# Patient Record
Sex: Female | Born: 2000 | Race: Black or African American | Hispanic: No | Marital: Single | Smoking: Never smoker
Health system: Southern US, Community
[De-identification: ages and names within clinical notes are randomized; demographics above are authoritative.]

---
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
## 2020-07-01 ENCOUNTER — Encounter (HOSPITAL_COMMUNITY): Payer: Self-pay

## 2020-07-01 ENCOUNTER — Ambulatory Visit (HOSPITAL_COMMUNITY)
Admission: EM | Admit: 2020-07-01 | Discharge: 2020-07-01 | Disposition: A | Discharge status: Home / Self Care | Payer: Medicaid Other | Attending: Emergency Medicine | Admitting: Emergency Medicine

## 2020-07-01 ENCOUNTER — Other Ambulatory Visit: Payer: Self-pay

## 2020-07-01 DIAGNOSIS — R051 Acute cough: Secondary | ICD-10-CM | POA: Diagnosis present

## 2020-07-01 DIAGNOSIS — J039 Acute tonsillitis, unspecified: Secondary | ICD-10-CM | POA: Diagnosis not present

## 2020-07-01 DIAGNOSIS — Z79899 Other long term (current) drug therapy: Secondary | ICD-10-CM | POA: Diagnosis not present

## 2020-07-01 DIAGNOSIS — Z20822 Contact with and (suspected) exposure to covid-19: Secondary | ICD-10-CM | POA: Insufficient documentation

## 2020-07-01 DIAGNOSIS — Z7722 Contact with and (suspected) exposure to environmental tobacco smoke (acute) (chronic): Secondary | ICD-10-CM | POA: Insufficient documentation

## 2020-07-01 LAB — SARS CORONAVIRUS 2 (TAT 6-24 HRS): SARS Coronavirus 2: NEGATIVE

## 2020-07-01 LAB — TSH: TSH: 1.083 u[IU]/mL (ref 0.350–4.500)

## 2020-07-01 LAB — POCT RAPID STREP A, ED / UC: Streptococcus, Group A Screen (Direct): NEGATIVE

## 2020-07-01 MED ORDER — AMOXICILLIN 500 MG PO CAPS: 500.0000 mg | ORAL_CAPSULE | Freq: Two times a day (BID) | ORAL | 0 refills | Status: AC

## 2020-07-01 NOTE — ED Provider Notes (Signed)
[facility]    CSN: [id number] Arrival date & time: 07/01/20  1234      History   Chief Complaint Chief Complaint  Patient presents with  . Cough  . Sore Throat    HPI Linda Galvan is a 20 y.o. female.   Linda Galvan presents with complaints of sore throat which started two days ago, pain with swallowing. She has a mild cough now, which causes pain. No known fevers. No rash. No gi symptoms. No ear pain. She states she feels like a "golf ball" to her esophagus. She has felt like it is swollen to her anterior neck. She has visualized "white spots" to her tonsils. No shortness of breath . Hasn't taken any medications for her symptoms. No thyroid issues in the past.    ROS per HPI, negative if not otherwise mentioned.      History reviewed. No pertinent past medical history.  There are no problems to display for this patient.   History reviewed. No pertinent surgical history.  OB History   No obstetric history on file.      Home Medications    Prior to Admission medications   Medication Sig Start Date End Date Taking? Authorizing Provider  amoxicillin (AMOXIL) 500 MG capsule Take 1 capsule (500 mg total) by mouth 2 (two) times daily for 10 days. 07/01/20 07/11/20 Yes Blima Jaimes, Barron Alvine, NP  cetirizine (ZYRTEC) 10 MG tablet Take 10 mg by mouth daily.    [provider]  fluticasone (FLONASE) 50 MCG/ACT nasal spray Place 2 sprays into the nose daily.    [provider]  ibuprofen (ADVIL,MOTRIN) 600 MG tablet Take 1 tablet (600 mg total) by mouth every 6 (six) hours as needed. 01/22/14   Toy Cookey, MD  naproxen sodium (ANAPROX) 220 MG tablet Take 110 mg by mouth daily as needed. For pain    [provider]  ranitidine (ZANTAC) 300 MG tablet Take 1 tablet (300 mg total) by mouth at bedtime. 01/22/14   Toy Cookey, MD    Family History History reviewed. No pertinent family history.  Social History Social History    Tobacco Use  . Smoking status: Passive Smoke Exposure - Never Smoker  . Smokeless tobacco: Never Used     Allergies   Patient has no known allergies.   Review of Systems Review of Systems   Physical Exam Triage Vital Signs ED Triage Vitals  Enc Vitals Group     BP 07/01/20 1256 135/72     Pulse Rate 07/01/20 1256 77     Resp 07/01/20 1256 20     Temp 07/01/20 1256 98.7 F (37.1 C)     Temp Source 07/01/20 1256 Oral     SpO2 07/01/20 1256 100 %     Weight --      Height --      Head Circumference --      Peak Flow --      Pain Score 07/01/20 1254 5     Pain Loc --      Pain Edu? --      Excl. in GC? --    No data found.  Updated Vital Signs BP 135/72 (BP Location: Right Arm)   Pulse 77   Temp 98.7 F (37.1 C) (Oral)   Resp 20   LMP  (LMP Unknown)   SpO2 100%   Visual Acuity Right Eye Distance:   Left Eye Distance:   Bilateral Distance:    Right Eye Near:  Left Eye Near:    Bilateral Near:     Physical Exam Constitutional:      General: She is not in acute distress.    Appearance: She is well-developed.  HENT:     Right Ear: Tympanic membrane normal.     Left Ear: Tympanic membrane normal.     Mouth/Throat:     Tonsils: Tonsillar exudate present. 2+ on the right. 2+ on the left.  Cardiovascular:     Rate and Rhythm: Normal rate.  Pulmonary:     Effort: Pulmonary effort is normal.  Lymphadenopathy:     Cervical: Cervical adenopathy present.     Right cervical: Superficial cervical adenopathy present.     Left cervical: Superficial cervical adenopathy present.  Skin:    General: Skin is warm and dry.  Neurological:     Mental Status: She is alert and oriented to person, place, and time.      UC Treatments / Results  Labs (all labs ordered are listed, but only abnormal results are displayed) Labs Reviewed  SARS CORONAVIRUS 2 (TAT 6-24 HRS)  TSH  POCT RAPID STREP A, ED / UC    EKG   Radiology No results  found.  Procedures Procedures (including critical care time)  Medications Ordered in UC Medications - No data to display  Initial Impression / Assessment and Plan / UC Course  I have reviewed the triage vital signs and the nursing notes.  Pertinent labs & imaging results that were available during my care of the patient were reviewed by me and considered in my medical decision making (see chart for details).     Exudative tonsillitis on exam, negative strep with culture pending. Covid testing pending and isolation instructions provided.  Likely cervical adenopathy, but also given location tsh collected as well, which is normal. Return precautions provided. Patient verbalized understanding and agreeable to plan.   Final Clinical Impressions(s) / UC Diagnoses   Final diagnoses:  Acute tonsillitis, unspecified etiology     Discharge Instructions     I am starting antibiotics for you given what your tonsils looks like today on exam.  We will notify you if your strep or covid test is positive. If negative you will not get a call- view your results on your MyCHart. If your covid-19 test is positive you can stop taking the antibiotics, as it is a virus which does not respond to antibiotics.  I suspect lymph node swelling is contributing to your neck swelling and sensation and will improve once your throat improves or shortly after.  We are screening to ensure your thyroid level is normal, however.  If any worsening of symptoms please return or go to the ER- particularly difficulty swallowing or if food starts coming back up.    ED Prescriptions    Medication Sig Dispense Auth. Provider   amoxicillin (AMOXIL) 500 MG capsule Take 1 capsule (500 mg total) by mouth 2 (two) times daily for 10 days. 20 capsule Georgetta Haber, NP     PDMP not reviewed this encounter.   Georgetta Haber, NP 07/01/20 2039

## 2020-07-01 NOTE — ED Triage Notes (Signed)
Pt c/o sore throat x 2 days. Pt states when she coughs her chest hurts.

## 2020-07-01 NOTE — Discharge Instructions (Signed)
I am starting antibiotics for you given what your tonsils looks like today on exam.  We will notify you if your strep or covid test is positive. If negative you will not get a call- view your results on your MyCHart. If your covid-19 test is positive you can stop taking the antibiotics, as it is a virus which does not respond to antibiotics.  I suspect lymph node swelling is contributing to your neck swelling and sensation and will improve once your throat improves or shortly after.  We are screening to ensure your thyroid level is normal, however.  If any worsening of symptoms please return or go to the ER- particularly difficulty swallowing or if food starts coming back up.

## 2020-07-02 LAB — CULTURE, GROUP A STREP (THRC)

## 2020-07-03 LAB — CULTURE, GROUP A STREP (THRC)

## 2020-09-06 ENCOUNTER — Encounter (HOSPITAL_COMMUNITY): Payer: Self-pay | Admitting: Emergency Medicine

## 2020-09-06 ENCOUNTER — Ambulatory Visit (HOSPITAL_COMMUNITY)
Admission: EM | Admit: 2020-09-06 | Discharge: 2020-09-06 | Disposition: A | Discharge status: Home / Self Care | Payer: Medicaid Other | Attending: Emergency Medicine | Admitting: Emergency Medicine

## 2020-09-06 DIAGNOSIS — R220 Localized swelling, mass and lump, head: Secondary | ICD-10-CM | POA: Diagnosis not present

## 2020-09-06 DIAGNOSIS — R6884 Jaw pain: Secondary | ICD-10-CM

## 2020-09-06 DIAGNOSIS — H6502 Acute serous otitis media, left ear: Secondary | ICD-10-CM

## 2020-09-06 MED ORDER — AMOXICILLIN-POT CLAVULANATE 875-125 MG PO TABS: 1.0000 | ORAL_TABLET | Freq: Two times a day (BID) | ORAL | 0 refills | Status: DC

## 2020-09-06 MED ORDER — TRAMADOL HCL 50 MG PO TABS: 50.0000 mg | ORAL_TABLET | Freq: Four times a day (QID) | ORAL | 0 refills | Status: DC | PRN

## 2020-09-06 MED ORDER — KETOROLAC TROMETHAMINE 60 MG/2ML IM SOLN
60.0000 mg | Freq: Once | INTRAMUSCULAR | Status: AC
  Administered 2020-09-06: 60 mg via INTRAMUSCULAR

## 2020-09-06 MED ORDER — KETOROLAC TROMETHAMINE 60 MG/2ML IM SOLN
INTRAMUSCULAR | Status: AC
  Filled 2020-09-06: qty 2

## 2020-09-06 NOTE — ED Provider Notes (Signed)
[facility]    CSN: [id number] Arrival date & time: 09/06/20  1130      History   Chief Complaint Chief Complaint  Patient presents with   Otalgia    left    HPI Linda Galvan is a 20 y.o. female.   Lt ear pain for a few days now,. Pt is tearful when in room in pain . States that it has caused swelling to her jaw area and neck. Tried peroxide and motrin to ear last night with no relief.    History reviewed. No pertinent past medical history.  There are no problems to display for this patient.   History reviewed. No pertinent surgical history.  OB History   No obstetric history on file.      Home Medications    Prior to Admission medications   Medication Sig Start Date End Date Taking? Authorizing Provider  amoxicillin-clavulanate (AUGMENTIN) 875-125 MG tablet Take 1 tablet by mouth every 12 (twelve) hours. 09/06/20  Yes Coralyn Mark, NP  traMADol (ULTRAM) 50 MG tablet Take 1 tablet (50 mg total) by mouth every 6 (six) hours as needed. 09/06/20  Yes Coralyn Mark, NP  cetirizine (ZYRTEC) 10 MG tablet Take 10 mg by mouth daily.    [provider]  fluticasone (FLONASE) 50 MCG/ACT nasal spray Place 2 sprays into the nose daily.    [provider]  ibuprofen (ADVIL,MOTRIN) 600 MG tablet Take 1 tablet (600 mg total) by mouth every 6 (six) hours as needed. 01/22/14   Toy Cookey, MD  naproxen sodium (ANAPROX) 220 MG tablet Take 110 mg by mouth daily as needed. For pain    [provider]  ranitidine (ZANTAC) 300 MG tablet Take 1 tablet (300 mg total) by mouth at bedtime. 01/22/14   Toy Cookey, MD    Family History History reviewed. No pertinent family history.  Social History Social History   Tobacco Use   Smoking status: Passive Smoke Exposure - Never Smoker   Smokeless tobacco: Never     Allergies   Patient has no known allergies.   Review of Systems Review of Systems  Constitutional:  Positive for  fever. Negative for appetite change.  HENT:  Positive for ear discharge and ear pain.        Lt side swelling down jaw area   Respiratory: Negative.    Cardiovascular: Negative.   Gastrointestinal: Negative.   Neurological: Negative.     Physical Exam Triage Vital Signs ED Triage Vitals  Enc Vitals Group     BP 09/06/20 1220 (!) 133/92     Pulse Rate 09/06/20 1220 88     Resp 09/06/20 1220 18     Temp 09/06/20 1220 98.8 F (37.1 C)     Temp src --      SpO2 09/06/20 1220 99 %     Weight --      Height --      Head Circumference --      Peak Flow --      Pain Score 09/06/20 1219 10     Pain Loc --      Pain Edu? --      Excl. in GC? --    No data found.  Updated Vital Signs BP (!) 133/92   Pulse 88   Temp 98.8 F (37.1 C)   Resp 18   SpO2 99%   Visual Acuity Right Eye Distance:   Left Eye Distance:   Bilateral Distance:  Right Eye Near:   Left Eye Near:    Bilateral Near:     Physical Exam Constitutional:      General: She is in acute distress.     Appearance: She is ill-appearing.  HENT:     Right Ear: Tympanic membrane, ear canal and external ear normal.     Left Ear: Drainage, swelling and tenderness present. Tympanic membrane is erythematous and bulging.     Mouth/Throat:     Mouth: Mucous membranes are moist.  Eyes:     Pupils: Pupils are equal, round, and reactive to light.  Cardiovascular:     Rate and Rhythm: Normal rate.     Pulses: Normal pulses.  Pulmonary:     Effort: Pulmonary effort is normal.  Abdominal:     General: Abdomen is flat.  Musculoskeletal:        General: Normal range of motion.     Cervical back: Tenderness present.  Skin:    General: Skin is warm.  Neurological:     Mental Status: She is alert.     UC Treatments / Results  Labs (all labs ordered are listed, but only abnormal results are displayed) Labs Reviewed - No data to display  EKG   Radiology No results found.  Procedures Procedures (including  critical care time)  Medications Ordered in UC Medications  ketorolac (TORADOL) injection 60 mg (60 mg Intramuscular Given 09/06/20 1308)    Initial Impression / Assessment and Plan / UC Course  I have reviewed the triage vital signs and the nursing notes.  Pertinent labs & imaging results that were available during my care of the patient were reviewed by me and considered in my medical decision making (see chart for details).     Discussed the risk of infection  Take full dose of abx  Take pain meds as needed with food  Avoid putting water or anything in the ear  If not better in 48 hours you will need to return   Final Clinical Impressions(s) / UC Diagnoses   Final diagnoses:  Non-recurrent acute serous otitis media of left ear  Jaw pain  Facial swelling   Discharge Instructions   None    ED Prescriptions     Medication Sig Dispense Auth. Provider   traMADol (ULTRAM) 50 MG tablet Take 1 tablet (50 mg total) by mouth every 6 (six) hours as needed. 15 tablet Maple Mirza L, NP   amoxicillin-clavulanate (AUGMENTIN) 875-125 MG tablet Take 1 tablet by mouth every 12 (twelve) hours. 14 tablet Coralyn Mark, NP      I have reviewed the PDMP during this encounter.   Coralyn Mark, NP 09/06/20 1312

## 2020-09-06 NOTE — ED Triage Notes (Signed)
Pt is present today with left ear pain, HA, and left jaw pain. Pt states that the pain started a couple months ago on and off.

## 2022-03-24 ENCOUNTER — Other Ambulatory Visit: Payer: Self-pay

## 2022-03-24 ENCOUNTER — Encounter (HOSPITAL_COMMUNITY): Payer: Self-pay | Admitting: *Deleted

## 2022-03-24 ENCOUNTER — Ambulatory Visit (HOSPITAL_COMMUNITY)
Admission: EM | Admit: 2022-03-24 | Discharge: 2022-03-24 | Disposition: A | Discharge status: Home / Self Care | Payer: Medicaid Other | Attending: Internal Medicine | Admitting: Internal Medicine

## 2022-03-24 DIAGNOSIS — J039 Acute tonsillitis, unspecified: Secondary | ICD-10-CM | POA: Insufficient documentation

## 2022-03-24 DIAGNOSIS — E049 Nontoxic goiter, unspecified: Secondary | ICD-10-CM | POA: Insufficient documentation

## 2022-03-24 LAB — T4, FREE: Free T4: 0.83 ng/dL (ref 0.61–1.12)

## 2022-03-24 LAB — TSH: TSH: 0.625 u[IU]/mL (ref 0.350–4.500)

## 2022-03-24 LAB — POCT RAPID STREP A, ED / UC: Streptococcus, Group A Screen (Direct): NEGATIVE

## 2022-03-24 LAB — POCT INFECTIOUS MONO SCREEN, ED / UC: Mono Screen: NEGATIVE

## 2022-03-24 MED ORDER — AMOXICILLIN 500 MG PO TABS: 500.0000 mg | ORAL_TABLET | Freq: Two times a day (BID) | ORAL | 0 refills | Status: AC

## 2022-03-24 NOTE — Discharge Instructions (Signed)
Go ahead and take antibiotic as prescribed.   I am prescribing this due to the appearance of your tonsils.  As we discussed, your thyroid appears quite enlarged on exam which does not seem like a new issue for you.  However, I would like you to follow-up with a primary care provider to have this worked up further.  I have obtained some basic labs today for them to follow-up on.  Go ahead and call [facility] on Monday to see if you can reestablish care.  Someone from our clinic will reach out to you should you need any help finding a provider.  Please return for any worsening or persistent symptoms.

## 2022-03-24 NOTE — ED Provider Notes (Signed)
[place]    CSN: [id number] Arrival date & time: 03/24/22  1058      History   Chief Complaint Chief Complaint  Patient presents with   Lymphadenopathy    HPI Linda Galvan is a 22 y.o. female presents urgent care today with sensation in her neck when swallowing.  Patient states symptoms began this morning and she noticed white spots on tonsils.  Patient denies any difficulty breathing, recent fever or chills.  She reports neck fullness has been present for years and has been mention to her several times in the past but has not been worked up.  Patient denies any recent fatigue, weight loss or gain, dry skin, brittle nails, heat or cold intolerance.   History reviewed. No pertinent past medical history.  There are no problems to display for this patient.   History reviewed. No pertinent surgical history.  OB History   No obstetric history on file.      Home Medications    Prior to Admission medications   Medication Sig Start Date End Date Taking? Authorizing Provider  amoxicillin (AMOXIL) 500 MG tablet Take 1 tablet (500 mg total) by mouth 2 (two) times daily for 10 days. 03/24/22 04/03/22 Yes Rudolpho Sevin, NP  cetirizine (ZYRTEC) 10 MG tablet Take 10 mg by mouth daily.    [provider]  fluticasone (FLONASE) 50 MCG/ACT nasal spray Place 2 sprays into the nose daily.    [provider]  ibuprofen (ADVIL,MOTRIN) 600 MG tablet Take 1 tablet (600 mg total) by mouth every 6 (six) hours as needed. 01/22/14   Ernestina Patches, MD  naproxen sodium (ANAPROX) 220 MG tablet Take 110 mg by mouth daily as needed. For pain    [provider]  ranitidine (ZANTAC) 300 MG tablet Take 1 tablet (300 mg total) by mouth at bedtime. 01/22/14   Ernestina Patches, MD  traMADol (ULTRAM) 50 MG tablet Take 1 tablet (50 mg total) by mouth every 6 (six) hours as needed. 09/06/20   Marney Setting, NP    Family History History reviewed. No pertinent family  history.  Social History Social History   Tobacco Use   Smoking status: Never    Passive exposure: Yes   Smokeless tobacco: Never     Allergies   Patient has no known allergies.   Review of Systems As stated in HPI otherwise negative   Physical Exam Triage Vital Signs ED Triage Vitals  Enc Vitals Group     BP 03/24/22 1225 120/75     Pulse Rate 03/24/22 1225 74     Resp 03/24/22 1225 16     Temp 03/24/22 1225 98.7 F (37.1 C)     Temp src --      SpO2 03/24/22 1225 97 %     Weight --      Height --      Head Circumference --      Peak Flow --      Pain Score 03/24/22 1223 1     Pain Loc --      Pain Edu? --      Excl. in [place]? --    No data found.  Updated Vital Signs BP 120/75   Pulse 74   Temp 98.7 F (37.1 C)   Resp 16   SpO2 97%   Visual Acuity Right Eye Distance:   Left Eye Distance:   Bilateral Distance:    Right Eye Near:   Left Eye Near:  Bilateral Near:     Physical Exam Constitutional:      General: She is not in acute distress.    Appearance: Normal appearance. She is not ill-appearing or toxic-appearing.  HENT:     Nose: No congestion or rhinorrhea.     Mouth/Throat:     Mouth: Mucous membranes are moist.     Pharynx: No posterior oropharyngeal erythema.     Comments: 2-3+ tonsillar swelling bilaterally with scattered tonsillar stones.  Airway intact Eyes:     Extraocular Movements: Extraocular movements intact.     Conjunctiva/sclera: Conjunctivae normal.  Neck:     Comments: Thyromegaly Cardiovascular:     Rate and Rhythm: Normal rate and regular rhythm.     Heart sounds: No murmur heard.    No friction rub. No gallop.  Pulmonary:     Effort: Pulmonary effort is normal.     Breath sounds: Normal breath sounds. No stridor. No wheezing, rhonchi or rales.  Abdominal:     General: Bowel sounds are normal.     Palpations: Abdomen is soft.  Musculoskeletal:     Cervical back: Normal range of motion and neck supple. No  rigidity or tenderness.  Lymphadenopathy:     Cervical: No cervical adenopathy.  Skin:    General: Skin is warm and dry.  Neurological:     General: No focal deficit present.     Mental Status: She is alert and oriented to person, place, and time.  Psychiatric:        Mood and Affect: Mood normal.        Behavior: Behavior normal.      UC Treatments / Results  Labs (all labs ordered are listed, but only abnormal results are displayed) Labs Reviewed  CULTURE, GROUP A STREP ([place])  T3  T4, FREE  TSH  POCT RAPID STREP A, ED / UC  POCT INFECTIOUS MONO SCREEN, ED / UC    EKG   Radiology No results found.  Procedures Procedures (including critical care time)  Medications Ordered in UC Medications - No data to display  Initial Impression / Assessment and Plan / UC Course  I have reviewed the triage vital signs and the nursing notes.  Pertinent labs & imaging results that were available during my care of the patient were reviewed by me and considered in my medical decision making (see chart for details)  Tonsillitis Thyromegaly Patient appears to have 2 different issues at time of visit.  Tonsillitis appears acute.  Given appearance of tonsils we will go ahead and treat empirically with amoxicillin twice daily x 10 days despite rapid negative strep.  Rapid Monospot negative.  Will send swab for culture.  Upon discussion of patient's fullness and neck, she reports that has been present for several years though never worked up.  She denies any symptoms of hypo or hyperthyroidism.  Will check basic thyroid function panel in clinic today and have patient establish with primary provider for further evaluation.  She will likely need ultrasound and possible FNA.  She was previously a patient at [facility].  I have asked her to reach out to them Monday to see if she can reestablish care.  Reviewed expections re: course of current medical issues. Questions answered. Outlined  signs and symptoms indicating need for more acute intervention. Pt verbalized understanding. AVS given  Final Clinical Impressions(s) / UC Diagnoses   Final diagnoses:  Acute tonsillitis, unspecified etiology  Thyroid enlargement     Discharge Instructions  Go ahead and take antibiotic as prescribed.   I am prescribing this due to the appearance of your tonsils.  As we discussed, your thyroid appears quite enlarged on exam which does not seem like a new issue for you.  However, I would like you to follow-up with a primary care provider to have this worked up further.  I have obtained some basic labs today for them to follow-up on.  Go ahead and call [facility] on Monday to see if you can reestablish care.  Someone from our clinic will reach out to you should you need any help finding a provider.  Please return for any worsening or persistent symptoms.     ED Prescriptions     Medication Sig Dispense Auth. Provider   amoxicillin (AMOXIL) 500 MG tablet Take 1 tablet (500 mg total) by mouth 2 (two) times daily for 10 days. 20 tablet Rudolpho Sevin, NP      PDMP not reviewed this encounter.   Rudolpho Sevin, NP 03/24/22 1420

## 2022-03-24 NOTE — ED Triage Notes (Signed)
Pt reports feeling a swollen sensation in her neck when swallowing. This started this AM.

## 2022-03-25 LAB — CULTURE, GROUP A STREP (THRC)

## 2022-03-26 LAB — CULTURE, GROUP A STREP (THRC)

## 2022-03-27 LAB — T3: T3, Total: 101 ng/dL (ref 71–180)

## 2022-04-01 ENCOUNTER — Encounter (HOSPITAL_COMMUNITY): Payer: Self-pay

## 2022-04-01 ENCOUNTER — Ambulatory Visit (HOSPITAL_COMMUNITY)
Admission: RE | Admit: 2022-04-01 | Discharge: 2022-04-01 | Disposition: A | Discharge status: Home / Self Care | Payer: Medicaid Other | Source: Ambulatory Visit | Attending: Emergency Medicine | Admitting: Emergency Medicine

## 2022-04-01 ENCOUNTER — Other Ambulatory Visit: Payer: Self-pay

## 2022-04-01 VITALS — BP 124/68 | HR 94 | Temp 98.7°F | Resp 18

## 2022-04-01 DIAGNOSIS — B379 Candidiasis, unspecified: Secondary | ICD-10-CM | POA: Insufficient documentation

## 2022-04-01 DIAGNOSIS — T3695XA Adverse effect of unspecified systemic antibiotic, initial encounter: Secondary | ICD-10-CM | POA: Insufficient documentation

## 2022-04-01 MED ORDER — FLUCONAZOLE 150 MG PO TABS: 150.0000 mg | ORAL_TABLET | Freq: Once | ORAL | 0 refills | Status: DC | PRN

## 2022-04-01 NOTE — Discharge Instructions (Addendum)
Please finish the full course of amoxicillin  Take the fluconazole as prescribed to treat possible yeast infection. We will call you if swab results require further treatment   If needed you can return to urgent care, or call the ob/gyn to make an appointment

## 2022-04-01 NOTE — ED Provider Notes (Signed)
[place]    CSN: [id number] Arrival date & time: 04/01/22  1359     History   Chief Complaint Chief Complaint  Patient presents with   Vaginal Discharge    I couldn't find my option but i have been taking antibiotics for my tonsils, but its been making me have vaginal pain , - Entered by patient   Vaginal Pain   Vaginal Itching    HPI Linda Galvan is a 22 y.o. female.  Presents with vaginal pain and itching White, clumpy vaginal discharge  Dx with strep one week ago, has been taking amoxicillin. Symptoms started 2 days after starting antibiotic No bleeding, spotting, abd pain  History reviewed. No pertinent past medical history.  There are no problems to display for this patient.   History reviewed. No pertinent surgical history.  OB History   No obstetric history on file.      Home Medications    Prior to Admission medications   Medication Sig Start Date End Date Taking? Authorizing Provider  fluconazole (DIFLUCAN) 150 MG tablet Take 1 tablet (150 mg total) by mouth once as needed for up to 2 doses (take one pill on day 1, and the second pill 3 days later). 04/01/22  Yes Bryley Kovacevic, Wells Guiles, PA-C  amoxicillin (AMOXIL) 500 MG tablet Take 1 tablet (500 mg total) by mouth 2 (two) times daily for 10 days. 03/24/22 04/03/22  Rudolpho Sevin, NP  cetirizine (ZYRTEC) 10 MG tablet Take 10 mg by mouth daily.    [provider]  fluticasone (FLONASE) 50 MCG/ACT nasal spray Place 2 sprays into the nose daily.    [provider]  naproxen sodium (ANAPROX) 220 MG tablet Take 110 mg by mouth daily as needed. For pain    [provider]    Family History History reviewed. No pertinent family history.  Social History Social History   Tobacco Use   Smoking status: Never    Passive exposure: Yes   Smokeless tobacco: Never     Allergies   Patient has no known allergies.   Review of Systems Review of Systems As per HPI  Physical  Exam Triage Vital Signs ED Triage Vitals  Enc Vitals Group     BP      Pulse      Resp      Temp      Temp src      SpO2      Weight      Height      Head Circumference      Peak Flow      Pain Score      Pain Loc      Pain Edu?      Excl. in [place]?    No data found.  Updated Vital Signs BP 124/68   Pulse 94   Temp 98.7 F (37.1 C)   Resp 18   SpO2 97%   Physical Exam Vitals and nursing note reviewed.  Constitutional:      Appearance: Normal appearance.  HENT:     Mouth/Throat:     Pharynx: Oropharynx is clear.  Cardiovascular:     Rate and Rhythm: Normal rate and regular rhythm.     Pulses: Normal pulses.  Pulmonary:     Effort: Pulmonary effort is normal.  Abdominal:     Palpations: Abdomen is soft.     Tenderness: There is no abdominal tenderness.  Genitourinary:    Comments: deferred Neurological:  Mental Status: She is alert and oriented to person, place, and time.     UC Treatments / Results  Labs (all labs ordered are listed, but only abnormal results are displayed) Labs Reviewed  CERVICOVAGINAL ANCILLARY ONLY    EKG  Radiology No results found.  Procedures Procedures (including critical care time)  Medications Ordered in UC Medications - No data to display  Initial Impression / Assessment and Plan / UC Course  I have reviewed the triage vital signs and the nursing notes.  Pertinent labs & imaging results that were available during my care of the patient were reviewed by me and considered in my medical decision making (see chart for details).  Cytology swab pending for BV and yeast Given abx use, will cover for yeast with 2 dose fluconazole  Discussed continue full course of amox until completion  Provided ob/gyn clinic for follow up prn  Final Clinical Impressions(s) / UC Diagnoses   Final diagnoses:  Antibiotic-induced yeast infection     Discharge Instructions      Please finish the full course of amoxicillin  Take  the fluconazole as prescribed to treat possible yeast infection. We will call you if swab results require further treatment   If needed you can return to urgent care, or call the ob/gyn to make an appointment      ED Prescriptions     Medication Sig Dispense Auth. Provider   fluconazole (DIFLUCAN) 150 MG tablet Take 1 tablet (150 mg total) by mouth once as needed for up to 2 doses (take one pill on day 1, and the second pill 3 days later). 2 tablet Shavona Gunderman, Wells Guiles, PA-C      PDMP not reviewed this encounter.   Les Pou, [place] 04/01/22 1441

## 2022-04-01 NOTE — ED Triage Notes (Signed)
Pt reports vaginal pain and itching after starting anti-bx.

## 2022-04-02 LAB — CERVICOVAGINAL ANCILLARY ONLY
Bacterial Vaginitis (gardnerella): NEGATIVE
Candida Glabrata: NEGATIVE
Candida Vaginitis: POSITIVE — AB
Comment: NEGATIVE
Comment: NEGATIVE
Comment: NEGATIVE

## 2022-04-05 ENCOUNTER — Ambulatory Visit (INDEPENDENT_AMBULATORY_CARE_PROVIDER_SITE_OTHER): Payer: Medicaid Other | Admitting: Student

## 2022-04-05 ENCOUNTER — Encounter: Payer: Self-pay | Admitting: Student

## 2022-04-05 VITALS — BP 110/80 | HR 97 | Temp 98.9°F | Ht 61.0 in | Wt 158.8 lb

## 2022-04-05 DIAGNOSIS — Z7689 Persons encountering health services in other specified circumstances: Secondary | ICD-10-CM

## 2022-04-05 DIAGNOSIS — E049 Nontoxic goiter, unspecified: Secondary | ICD-10-CM

## 2022-04-05 NOTE — Progress Notes (Signed)
New Patient Office Visit  Subjective    Patient ID: Linda Galvan, female    DOB: 2000/05/04  Age: 22 y.o. MRN: [id number]  CC:  Chief Complaint  Patient presents with   Establish Care    HPI Linda Galvan presents to establish care Normal establish PCP. 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   fluconazole (DIFLUCAN) 150 MG tablet Take 1 tablet (150 mg total) by mouth once as needed for up to 2 doses (take one pill on day 1, and the second pill 3 days later).   fluticasone (FLONASE) 50 MCG/ACT nasal spray Place 2 sprays into the nose daily.   naproxen sodium (ANAPROX) 220 MG tablet Take 110 mg by mouth daily as needed. For pain   No facility-administered encounter medications on file as of 04/05/2022.    No past medical history on file.  No past surgical history on file.  Family History  Family history unknown: Yes    Social History   Socioeconomic History   Marital status: Single    Spouse name: Not on file   Number of children: Not on file   Years of education: Not on file   Highest education level: Not on file  Occupational History   Occupation: Personal Care Assistance  Tobacco Use   Smoking status: Never    Passive exposure: Yes   Smokeless tobacco: Never  Substance and Sexual Activity   Alcohol use: Yes    Comment: Occassionally   Drug use: Yes    Types: Marijuana   Sexual activity: Yes  Other Topics Concern    Not on file  Social History Narrative   Not on file   Social Determinants of Health   Financial Resource Strain: Not on file  Food Insecurity: Not on file  Transportation Needs: Not on file  Physical Activity: Not on file  Stress: Not on file  Social Connections: Not on file  Intimate Partner Violence: Not on file    Objective    BP 110/80   Pulse 97   Temp 98.9 F (37.2 C)   Ht '5\' 1"'$  (1.549 m)   Wt 158 lb 12.8 oz (72 kg)   SpO2 97%   BMI 30.00 kg/m   Physical exam General: Alert, well appearing, NAD HEENT: Atraumatic, MMM, No sclera icterus. Mildly enlarged thyroid CV: RRR, no murmurs, normal S1/S2 Pulm: CTAB, good WOB on RA, no crackles or wheezing Abd: Soft, no distension, no tenderness Skin: dry, warm Ext: No BLE edema, +2 Pedal and radial pulse.    Assessment & Plan:   Patient is a 22 year old previously healthy female presenting today to establish care.  No medical concerns today.  On exam was noted to have mildly enlarged thyroid, recent TSH 2 weeks ago was within normal limits and patient is currently asymptomatic.  Will continue to monitor closely at this time.   Follow up in 2-6 weeks to schedule annual wellness visit.  Alen Bleacher, MD

## 2022-04-05 NOTE — Patient Instructions (Signed)
It was wonderful to meet you today. Thank you for allowing me to be a part of your care. Below is a short summary of what we discussed at your visit today:  Welcome to [facility]!  Please schedule your annual wellness visit appointments for the next 2-6 weeks.  Please bring all of your medications to every appointment!  If you have any questions or concerns, please do not hesitate to contact us via phone or MyChart message.   Alen Bleacher, MD [facility]

## 2022-04-24 ENCOUNTER — Encounter: Payer: Self-pay | Admitting: Student

## 2022-05-08 ENCOUNTER — Encounter: Payer: Medicaid Other | Admitting: Student

## 2022-07-20 ENCOUNTER — Ambulatory Visit (HOSPITAL_COMMUNITY)
Admission: RE | Admit: 2022-07-20 | Discharge: 2022-07-20 | Disposition: A | Discharge status: Home / Self Care | Payer: Medicaid Other | Source: Ambulatory Visit | Attending: Emergency Medicine | Admitting: Emergency Medicine

## 2022-07-20 ENCOUNTER — Encounter (HOSPITAL_COMMUNITY): Payer: Self-pay

## 2022-07-20 VITALS — BP 111/79 | HR 84 | Temp 99.3°F | Resp 16

## 2022-07-20 DIAGNOSIS — H6122 Impacted cerumen, left ear: Secondary | ICD-10-CM | POA: Diagnosis not present

## 2022-07-20 DIAGNOSIS — H60392 Other infective otitis externa, left ear: Secondary | ICD-10-CM | POA: Diagnosis not present

## 2022-07-20 MED ORDER — CARBAMIDE PEROXIDE 6.5 % OT SOLN: 5.0000 [drp] | Freq: Two times a day (BID) | OTIC | 0 refills | Status: DC

## 2022-07-20 MED ORDER — OFLOXACIN 0.3 % OT SOLN: 10.0000 [drp] | Freq: Every day | OTIC | 0 refills | Status: AC

## 2022-07-20 MED ORDER — OFLOXACIN 0.3 % OT SOLN: 5.0000 [drp] | Freq: Two times a day (BID) | OTIC | 0 refills | Status: DC

## 2022-07-20 NOTE — ED Triage Notes (Signed)
Pt presents to uc with co of Left ear pain for 2 days. No otc meds

## 2022-07-20 NOTE — Discharge Instructions (Signed)
I am covering you with antibiotic eardrops for your external ear infection.  Please do 10 drops once daily for the next 7 days.  Afterwards, you can start using the wax softening drops as well.  Unfortunately, sometimes there is vertigo associated with the cerumen impaction irrigation.  Please return to clinic or follow-up with your PCP for reevaluation within the next week or so.  Please return to clinic for any new or urgent symptoms.

## 2022-07-20 NOTE — ED Provider Notes (Signed)
[facility]    CSN: [id number] Arrival date & time: 07/20/22  1655      History   Chief Complaint Chief Complaint  Patient presents with   Otalgia    HPI Linda Galvan is a 21 y.o. female.   Patient presents to clinic for left ear pain for the past 2 days.  She notices some drainage when she puts her finger in her ear.  She did use a Q-tip a few days ago, and she felt something in there, feels like she should have the back deeper.  Denies any drainage from her ear specifically, denies any fever or recent illness.  Has not been swimming recently.  The history is provided by the patient and medical records.  Otalgia Associated symptoms: no congestion, no ear discharge and no fever     History reviewed. No pertinent past medical history.  Patient Active Problem List   Diagnosis Date Noted   Enlarged thyroid 04/05/2022    History reviewed. No pertinent surgical history.  OB History   No obstetric history on file.      Home Medications    Prior to Admission medications   Medication Sig Start Date End Date Taking? Authorizing Provider  carbamide peroxide (DEBROX) 6.5 % OTIC solution Place 5 drops into both ears 2 (two) times daily. 07/20/22  Yes Rinaldo Ratel, [place] N, FNP  cetirizine (ZYRTEC) 10 MG tablet Take 10 mg by mouth daily.    [provider]  fluconazole (DIFLUCAN) 150 MG tablet Take 1 tablet (150 mg total) by mouth once as needed for up to 2 doses (take one pill on day 1, and the second pill 3 days later). 04/01/22   Rising, Lurena Joiner, PA-C  fluticasone (FLONASE) 50 MCG/ACT nasal spray Place 2 sprays into the nose daily.    [provider]  naproxen sodium (ANAPROX) 220 MG tablet Take 110 mg by mouth daily as needed. For pain    [provider]  ofloxacin (FLOXIN) 0.3 % OTIC solution Place 10 drops into the left ear daily for 7 days. 07/20/22 07/27/22  Ericha Whittingham, [place] N, FNP    Family History Family History  Family history  unknown: Yes    Social History Social History   Tobacco Use   Smoking status: Never    Passive exposure: Yes   Smokeless tobacco: Never  Substance Use Topics   Alcohol use: Yes    Comment: Occassionally   Drug use: Yes    Types: Marijuana     Allergies   Patient has no known allergies.   Review of Systems Review of Systems  Constitutional:  Negative for fever.  HENT:  Positive for ear pain. Negative for congestion and ear discharge.      Physical Exam Triage Vital Signs ED Triage Vitals [07/20/22 1710]  Enc Vitals Group     BP 111/79     Pulse Rate 84     Resp 16     Temp 99.3 F (37.4 C)     Temp src      SpO2 98 %     Weight      Height      Head Circumference      Peak Flow      Pain Score 2     Pain Loc      Pain Edu?      Excl. in [place]?    No data found.  Updated Vital Signs BP 111/79   Pulse 84   Temp 99.3  F (37.4 C)   Resp 16   LMP 07/15/2022   SpO2 98%   Visual Acuity Right Eye Distance:   Left Eye Distance:   Bilateral Distance:    Right Eye Near:   Left Eye Near:    Bilateral Near:     Physical Exam Vitals and nursing note reviewed.  Constitutional:      Appearance: Normal appearance.  HENT:     Head: Normocephalic and atraumatic.     Right Ear: Tympanic membrane, ear canal and external ear normal.     Left Ear: Tenderness present. There is impacted cerumen.     Nose: Nose normal.     Mouth/Throat:     Mouth: Mucous membranes are moist.  Eyes:     Conjunctiva/sclera: Conjunctivae normal.  Cardiovascular:     Rate and Rhythm: Normal rate.  Pulmonary:     Effort: Pulmonary effort is normal. No respiratory distress.  Skin:    General: Skin is warm and dry.  Neurological:     General: No focal deficit present.     Mental Status: She is alert.  Psychiatric:        Mood and Affect: Mood normal.        Behavior: Behavior is cooperative.      UC Treatments / Results  Labs (all labs ordered are listed, but only  abnormal results are displayed) Labs Reviewed - No data to display  EKG   Radiology No results found.  Procedures Procedures (including critical care time)  Medications Ordered in UC Medications - No data to display  Initial Impression / Assessment and Plan / UC Course  I have reviewed the triage vital signs and the nursing notes.  Pertinent labs & imaging results that were available during my care of the patient were reviewed by me and considered in my medical decision making (see chart for details).  Vitals and triage reviewed, patient is hemodynamically stable.  Initially left ear was occluded by cerumen impaction.  Patient not able to fully tolerate irrigation without vertigo and pain.  Afterwards, tympanic membrane not fully visible due to remaining cerumen.  External auditory canal is tender and erythematous.  Will cover with ofloxacin drops daily for the next 7 days.  Encouraged use the wax softening drops as well.  Plan of care, follow-up care and return precautions given, no questions at this time.     Final Clinical Impressions(s) / UC Diagnoses   Final diagnoses:  Impacted cerumen of left ear  Other infective acute otitis externa of left ear     Discharge Instructions      I am covering you with antibiotic eardrops for your external ear infection.  Please do 10 drops once daily for the next 7 days.  Afterwards, you can start using the wax softening drops as well.  Unfortunately, sometimes there is vertigo associated with the cerumen impaction irrigation.  Please return to clinic or follow-up with your PCP for reevaluation within the next week or so.  Please return to clinic for any new or urgent symptoms.     ED Prescriptions     Medication Sig Dispense Auth. Provider   ofloxacin (FLOXIN) 0.3 % OTIC solution  (Status: Discontinued) Place 5 drops into the left ear 2 (two) times daily for 7 days. 5 mL Rinaldo Ratel, [place] N, FNP   carbamide peroxide (DEBROX) 6.5 %  OTIC solution Place 5 drops into both ears 2 (two) times daily. 15 mL Shuaib Corsino, [place] N, FNP   ofloxacin (  FLOXIN) 0.3 % OTIC solution Place 10 drops into the left ear daily for 7 days. 5 mL Daxtyn Rottenberg, [place] N, FNP      PDMP not reviewed this encounter.   Prudy Candy, [place] N, [place] 07/20/22 1747

## 2022-10-19 ENCOUNTER — Ambulatory Visit: Payer: Medicaid Other | Admitting: Student

## 2022-10-29 ENCOUNTER — Other Ambulatory Visit: Payer: Self-pay

## 2022-10-29 ENCOUNTER — Encounter: Payer: Self-pay | Admitting: Student

## 2022-10-29 ENCOUNTER — Ambulatory Visit (INDEPENDENT_AMBULATORY_CARE_PROVIDER_SITE_OTHER): Payer: Medicaid Other | Admitting: Student

## 2022-10-29 ENCOUNTER — Other Ambulatory Visit (HOSPITAL_COMMUNITY)
Admission: RE | Admit: 2022-10-29 | Discharge: 2022-10-29 | Disposition: A | Discharge status: Home / Self Care | Payer: Medicaid Other | Source: Ambulatory Visit | Attending: Family Medicine | Admitting: Family Medicine

## 2022-10-29 VITALS — BP 132/98 | HR 94 | Ht 61.0 in | Wt 168.2 lb

## 2022-10-29 DIAGNOSIS — Z124 Encounter for screening for malignant neoplasm of cervix: Secondary | ICD-10-CM

## 2022-10-29 DIAGNOSIS — Z1159 Encounter for screening for other viral diseases: Secondary | ICD-10-CM | POA: Diagnosis not present

## 2022-10-29 DIAGNOSIS — Z23 Encounter for immunization: Secondary | ICD-10-CM | POA: Diagnosis not present

## 2022-10-29 DIAGNOSIS — Z113 Encounter for screening for infections with a predominantly sexual mode of transmission: Secondary | ICD-10-CM

## 2022-10-29 NOTE — Patient Instructions (Addendum)
It was wonderful to meet you today. Thank you for allowing me to be a part of your care. Below is a short summary of what we discussed at your visit today:  Today we collected sample to for your Pap smear.  We also tested you for STD.  HIV, syphilis and hep C screening.  And today we gave you your COVID and flu vaccine today.  If you have any questions or concerns, please do not hesitate to contact us via phone or MyChart message.   Jerre Simon, MD [facility]

## 2022-10-29 NOTE — Progress Notes (Addendum)
    SUBJECTIVE:   CHIEF COMPLAINT / HPI:   Patient is a 22 year old female presenting today for Pap smear.  Denies any vaginal discharge, irritation or odor.  She is due for her Pap smear and this would be her first Pap smear.  Sexually active with 1 partner and inconsistent with barrier protection.  PERTINENT  PMH / PSH: Reviewed  OBJECTIVE:   BP (!) 132/98   Pulse 94   Ht 5\' 1"  (1.549 m)   Wt 168 lb 3.2 oz (76.3 kg)   SpO2 100%   BMI 31.78 kg/m    Physical Exam General: Alert, well appearing, NAD Cardiovascular: RRR, No Murmurs, Normal S2/S2 Respiratory: CTAB, No wheezing or Rales Abdomen: No distension or tenderness Genitalia:  Normal introitus for age, no external lesions, no vaginal discharge, mucosa pink and moist, no friaility or hemorrhage, normal uterus size and position   CMA Desiree served as Biomedical engineer for exam  ASSESSMENT/PLAN:   Healthcare  maintenance Patient is due for Pap smear.  Vaginal swab was obtained and Pap pathology collected and sent to lab.  STD screening Sexually active patient currently asymptomatic and inconsistent with condom use presenting for STD screening. -Obtained vaginal sample for GC/chlamydia/trichomonas. -Ordered labs for HIV, RPR and hep C.   Jerre Simon, MD [facility]

## 2022-10-29 NOTE — Progress Notes (Signed)
Covid-19 Vaccination Clinic  Name:  Linda Galvan    MRN: [id number] DOB: Nov 16, 2000  10/29/2022  [name] was observed post Covid-19 immunization for 15 minutes without incident. She was provided with Vaccine Information Sheet and instruction to access the V-Safe system.   [name] was instructed to call 911 with any severe reactions post vaccine: Difficulty breathing  Swelling of face and throat  A fast heartbeat  A bad rash all over body  Dizziness and weakness

## 2022-10-30 LAB — RPR: RPR Ser Ql: NONREACTIVE

## 2022-10-30 LAB — HCV INTERPRETATION

## 2022-10-30 LAB — HCV AB W REFLEX TO QUANT PCR: HCV Ab: NONREACTIVE

## 2022-10-30 LAB — HIV ANTIBODY (ROUTINE TESTING W REFLEX): HIV Screen 4th Generation wRfx: NONREACTIVE

## 2022-11-01 LAB — CYTOLOGY - PAP
Chlamydia: NEGATIVE
Comment: NEGATIVE
Comment: NEGATIVE
Comment: NORMAL
Diagnosis: NEGATIVE
Neisseria Gonorrhea: NEGATIVE
Trichomonas: NEGATIVE

## 2023-04-12 ENCOUNTER — Ambulatory Visit: Admitting: Student

## 2023-04-12 NOTE — Progress Notes (Deleted)
    SUBJECTIVE:   CHIEF COMPLAINT / HPI:   ***  PERTINENT  PMH / PSH: ***  OBJECTIVE:   There were no vitals taken for this visit.  ***  ASSESSMENT/PLAN:   No problem-specific Assessment & Plan notes found for this encounter.     Levin Erp, MD [facility]

## 2023-04-22 ENCOUNTER — Encounter: Payer: Self-pay | Admitting: Student

## 2023-04-22 ENCOUNTER — Ambulatory Visit (INDEPENDENT_AMBULATORY_CARE_PROVIDER_SITE_OTHER): Admitting: Student

## 2023-04-22 VITALS — BP 127/74 | HR 75 | Temp 99.0°F | Wt 172.2 lb

## 2023-04-22 DIAGNOSIS — Z23 Encounter for immunization: Secondary | ICD-10-CM

## 2023-04-22 DIAGNOSIS — Z0000 Encounter for general adult medical examination without abnormal findings: Secondary | ICD-10-CM | POA: Diagnosis not present

## 2023-04-22 NOTE — Assessment & Plan Note (Addendum)
 Reviewed patient's Family Medical History Reviewed and updated list of patient's medical providers Counseled patient on alcohol use Recommend moderate exercise at least 150 hours a week Emphasized need for healthy dieting Assessment of cognitive impairment was done Assessed patient's functional ability Counseled patient on sleep hygiene with goal of at least 7 hours of sleep nightly. Health Risk Assessent Completed and Reviewed.

## 2023-04-22 NOTE — Patient Instructions (Addendum)
 Pleasure to see you today.  Glad to hear you are doing well overall.  Recommend proper sleeping hygiene.  Which includes trying to go to sleep around 10 PM and getting at least 8 hours of sleep a day.  This usually will help with your memory, sleep schedule and energy throughout the day.  Turn off your phone and TV before going to bed so we will usually help you fall asleep.  If you your HPV and tetanus booster vaccines.  Also I recommend consistent use of condoms to reduce risk of sexually transmitted disease such as HIV, syphilis or chlamydia/gonorrhea.

## 2023-04-22 NOTE — Progress Notes (Signed)
    SUBJECTIVE:   CHIEF COMPLAINT / HPI:   23 year old female with no significant past medical history Presenting today for annual well visits.  Diet:Regular, good appetite. Likes pasta and [place] food Sleep: 3-4hrs of sleep. No inssues falling asleep just be on her phone watching TikTok and IG  Exercise: No but move around with work Alcohol use:  Occasionally  Tobacco use: No Illicit drug use: Marijuana everyday Sexually active: Yes 1 female partner. Inconsistent with condome use Works as: Engineer, agricultural  Lives with: Mother  Medical concerns: None    PERTINENT  PMH / PSH: Reviewed  OBJECTIVE:   BP 127/74   Pulse 75   Temp 99 F (37.2 C)   Wt 172 lb 3.2 oz (78.1 kg)   SpO2 100%   BMI 32.54 kg/m    Physical Exam General: Alert, well appearing, NAD Cardiovascular: RRR, No Murmurs, Normal S2/S2 Respiratory: CTAB, No wheezing or Rales Abdomen: No distension or tenderness Extremities: No edema on extremities   Skin: Warm and dry  ASSESSMENT/PLAN:   Encounter for annual health examination Reviewed patient's Family Medical History Reviewed and updated list of patient's medical providers Counseled patient on alcohol use Recommend moderate exercise at least 150 hours a week Emphasized need for healthy dieting Assessment of cognitive impairment was done Assessed patient's functional ability Counseled patient on sleep hygiene with goal of at least 7 hours of sleep nightly. Health Risk Assessent Completed and Reviewed.  Jerre Simon, MD [facility]

## 2023-06-03 ENCOUNTER — Ambulatory Visit

## 2023-06-03 ENCOUNTER — Other Ambulatory Visit: Payer: Self-pay

## 2023-06-03 ENCOUNTER — Ambulatory Visit
Admission: RE | Admit: 2023-06-03 | Discharge: 2023-06-03 | Disposition: A | Discharge status: Home / Self Care | Payer: Self-pay | Source: Ambulatory Visit

## 2023-06-03 DIAGNOSIS — S134XXA Sprain of ligaments of cervical spine, initial encounter: Secondary | ICD-10-CM

## 2023-06-03 DIAGNOSIS — S233XXA Sprain of ligaments of thoracic spine, initial encounter: Secondary | ICD-10-CM

## 2023-06-03 NOTE — Progress Notes (Deleted)
    SUBJECTIVE:   CHIEF COMPLAINT / HPI:   Discussed the use of AI scribe software for clinical note transcription with the patient, who gave verbal consent to proceed.  History of Present Illness    PERTINENT  PMH / PSH: enlarged thyroid   OBJECTIVE:   There were no vitals taken for this visit.  ***  ASSESSMENT/PLAN:   Assessment & Plan   Assessment and Plan Assessment & Plan     Genora Kidd, MD [facility]

## 2023-07-11 ENCOUNTER — Other Ambulatory Visit (HOSPITAL_COMMUNITY)
Admission: RE | Admit: 2023-07-11 | Discharge: 2023-07-11 | Disposition: A | Discharge status: Home / Self Care | Source: Ambulatory Visit | Attending: Family Medicine | Admitting: Family Medicine

## 2023-07-11 ENCOUNTER — Ambulatory Visit: Admitting: Family Medicine

## 2023-07-11 ENCOUNTER — Encounter: Payer: Self-pay | Admitting: Family Medicine

## 2023-07-11 VITALS — BP 119/80 | HR 87 | Ht 61.0 in | Wt 165.6 lb

## 2023-07-11 DIAGNOSIS — Z113 Encounter for screening for infections with a predominantly sexual mode of transmission: Secondary | ICD-10-CM | POA: Insufficient documentation

## 2023-07-11 DIAGNOSIS — Z23 Encounter for immunization: Secondary | ICD-10-CM | POA: Diagnosis not present

## 2023-07-11 LAB — POCT WET PREP (WET MOUNT)
Clue Cells Wet Prep Whiff POC: NEGATIVE
Trichomonas Wet Prep HPF POC: ABSENT

## 2023-07-11 NOTE — Progress Notes (Signed)
    SUBJECTIVE:   CHIEF COMPLAINT / HPI:   STD Testing She does not have any symptoms or concerns at this time. Has a new partner recently and would like to get tested. FDLMP 06/30/23. Periods last 5 days; does have bad cramping, no heavy bleeding. Has Nexplanon, does not use condoms.  Last Pap 2024, negative.  PERTINENT  PMH / PSH: Reviewed.  OBJECTIVE:   BP 119/80   Pulse 87   Ht 5\' 1"  (1.549 m)   Wt 165 lb 9.6 oz (75.1 kg)   SpO2 100%   BMI 31.29 kg/m    General: Well-appearing, pleasant, no acute distress. HEENT: normocephalic, PERRLA, EOM grossly intact. Pulm: No increased work of breathing on room air. Extremities: no peripheral edema. Moves all extremities equally. Neuro: Alert and oriented x3 Psych:  Cognition and judgment appear intact. Alert, communicative, and cooperative.  GU Exam:  External exam: Normal-appearing female external genitalia.   Vaginal exam notable for normal vaginal ruggae.  Cervix without discharge or obvious lesion. Chaperoned exam, CMA Stacey.    ASSESSMENT/PLAN:   Assessment & Plan Screening examination for STD (sexually transmitted disease) Recently has new partner and desires testing - discussed testing options and patient elected gonorrhea, chlamydia, trichomonas, HIV, syphilis. Not having symptoms at this time but would like to test for yeast, BV as well. Does have Nexplanon in place and is happy with this. Pap UTD. - STD testing today - will let pt know results - HIV and RPR today - Pap negative last year; repeat due in 2027. Need for HPV vaccination 1st dose of HPV vaccine given March 2025; due for next HPV vaccine dose now. - HPV dose 2 given today. - dose 3 due in 12 weeks (no sooner than 10/11/23) - may return for nurse visit.    Omar Bibber, DO [facility]

## 2023-07-11 NOTE — Patient Instructions (Signed)
 Dear Caryle Class  Today we discussed the following concerns and plans:  STD testing - I will let you know the results.  You got your 2nd dose of the HPV vaccine today. You will be due for the 3rd dose after 10/11/23. You can call the clinic to schedule a nurse visit to have this vaccine given if you wish.  If you have any concerns, please call the clinic or schedule an appointment.  It was a pleasure to take care of you today. Be well!  Omar Bibber, DO [facility], PGY-1

## 2023-07-12 ENCOUNTER — Other Ambulatory Visit: Payer: Self-pay | Admitting: Family Medicine

## 2023-07-12 ENCOUNTER — Ambulatory Visit: Payer: Self-pay | Admitting: Family Medicine

## 2023-07-12 LAB — CERVICOVAGINAL ANCILLARY ONLY
Bacterial Vaginitis (gardnerella): POSITIVE — AB
Candida Glabrata: NEGATIVE
Candida Vaginitis: POSITIVE — AB
Chlamydia: NEGATIVE
Comment: NEGATIVE
Comment: NEGATIVE
Comment: NEGATIVE
Comment: NEGATIVE
Comment: NEGATIVE
Comment: NORMAL
Neisseria Gonorrhea: NEGATIVE
Trichomonas: NEGATIVE

## 2023-07-12 MED ORDER — FLUCONAZOLE 150 MG PO TABS: 150.0000 mg | ORAL_TABLET | Freq: Once | ORAL | 0 refills | Status: AC

## 2023-07-12 MED ORDER — METRONIDAZOLE 500 MG PO TABS: 500.0000 mg | ORAL_TABLET | Freq: Two times a day (BID) | ORAL | 0 refills | Status: AC

## 2023-07-13 LAB — HIV ANTIBODY (ROUTINE TESTING W REFLEX): HIV Screen 4th Generation wRfx: NONREACTIVE

## 2023-07-13 LAB — RPR: RPR Ser Ql: NONREACTIVE

## 2023-08-23 ENCOUNTER — Encounter: Payer: Self-pay | Admitting: Student

## 2023-08-23 ENCOUNTER — Encounter: Admitting: Student

## 2023-08-23 NOTE — Progress Notes (Signed)
 error

## 2023-09-05 ENCOUNTER — Ambulatory Visit (INDEPENDENT_AMBULATORY_CARE_PROVIDER_SITE_OTHER): Admitting: Student

## 2023-09-05 VITALS — BP 110/72 | HR 87 | Ht 64.0 in | Wt 160.4 lb

## 2023-09-05 DIAGNOSIS — Z3046 Encounter for surveillance of implantable subdermal contraceptive: Secondary | ICD-10-CM | POA: Diagnosis present

## 2023-09-05 LAB — POCT URINE PREGNANCY: Preg Test, Ur: NEGATIVE

## 2023-09-05 MED ORDER — ETONOGESTREL 68 MG ~~LOC~~ IMPL
68.0000 mg | DRUG_IMPLANT | Freq: Once | SUBCUTANEOUS | Status: AC
Start: 2023-09-05 — End: 2023-09-05
  Administered 2023-09-05: 68 mg via SUBCUTANEOUS

## 2023-09-05 NOTE — Addendum Note (Signed)
 Addended by: Farooq Petrovich C on: 09/05/2023 11:35 AM   Modules accepted: Orders

## 2023-09-05 NOTE — Patient Instructions (Signed)
 Pleasure to see you today.  Today we obtained a pregnancy test that was negative.  We took out your old Nexplanon  and placed a new one today.  Your next Nexplanon  will be due for removal on 09/04/2026.  There could be some bruising after the procedure. I would recommend  - Cold compress over the area - Avoid complete submersion in water for at least 24 hours - You can do ibuprofen  or Tylenol for pain relief.

## 2023-09-05 NOTE — Progress Notes (Signed)
    SUBJECTIVE:   CHIEF COMPLAINT / HPI:   23 year old female with no significant past medical history presenting today for contraceptive management.  Patient currently on Nexplanon  for contraceptive and unsure of when it was placed. Will like to have her old Nexplanon  removed and new one placed today.  PERTINENT  PMH / PSH: Reviewed   OBJECTIVE:   BP 110/72   Pulse 87   Ht 5' 4 (1.626 m)   Wt 160 lb 6.4 oz (72.8 kg)   LMP 08/28/2023 (Approximate)   SpO2 98%   BMI 27.53 kg/m    Physical Exam General: Alert, well appearing, NAD Cardiovascular: RRR, No Murmurs, Normal S2/S2 Respiratory: CTAB, No wheezing or Rales Skin: Warm and dry  ASSESSMENT/PLAN:   GYNECOLOGY PROCEDURE NOTE  Pernella Ackerley is a 23 y.o. No obstetric history on file. here for Nexplanon  removal and insertion. No other gynecologic concerns.   Nexplanon  Removal and Insertion  Patient identified, informed consent performed, consent signed.   Patient does understand that irregular bleeding is a very common side effect of this medication. She was advised to have backup contraception for one week after replacement of the implant. Pregnancy test in clinic today was negative.  Appropriate time out taken. Implanon  site identified. Area prepped in usual sterile fashon. One ml of 1% lidocaine was used to anesthetize the area at the distal end of the implant. A small stab incision was made right beside the implant on the distal portion. The Nexplanon  rod was grasped using hemostats and removed without difficulty. There was minimal blood loss. There were no complications. Area was then injected with 3 ml of 1 % lidocaine. She was re-prepped with betadine, Nexplanon  removed from packaging, Device confirmed in needle, then inserted full length of needle and withdrawn per handbook instructions. Nexplanon  was able to palpated in the patient's arm; patient palpated the insert herself.  There was minimal blood loss. Patient insertion  site covered with guaze and a pressure bandage to reduce any bruising. The patient tolerated the procedure well and was given post procedure instructions.  She was advised to have backup contraception for one week.    Norleen April, MD 09/05/2023, 9:53 AM PGY-3, [facility]

## 2023-09-10 ENCOUNTER — Ambulatory Visit (INDEPENDENT_AMBULATORY_CARE_PROVIDER_SITE_OTHER): Admitting: Family Medicine

## 2023-09-10 VITALS — BP 124/86 | HR 88 | Ht 64.0 in | Wt 165.4 lb

## 2023-09-10 DIAGNOSIS — Z3046 Encounter for surveillance of implantable subdermal contraceptive: Secondary | ICD-10-CM

## 2023-09-10 NOTE — Progress Notes (Cosign Needed Addendum)
    SUBJECTIVE:   CHIEF COMPLAINT / HPI:   Pain in arm s/p nexplanon  placement  Patient had removal and insertion of Nexplanon  on 09/05/2023.  She reports that since then she has had increased pain in her upper arm where the Nexplanon  was placed and with some radiation down her arm into the forearm.  Denies any numbness or tingling.  Denies any weakness of the arm.  Says that she was concerned because this is her third Nexplanon  and has not experienced the symptoms previously.  Denies fevers.  PERTINENT  PMH / PSH: None pertinent  OBJECTIVE:   BP 124/86   Pulse 88   Ht 5' 4 (1.626 m)   Wt 165 lb 6.4 oz (75 kg)   LMP 08/28/2023 (Approximate)   SpO2 100%   BMI 28.39 kg/m   General: well appearing, in no acute distress CV: radial and brachial pulses equal and palpable, cap refill less than 2 seconds, no BLE edema  Resp: Normal work of breathing on room air Neuro: Alert & Oriented x 4,  left arm:  finger strength 5 out of 5, shoulder, biceps, triceps strength 5 out of 5, sensation intact throughout, biceps and brachial radialis reflex normal Mild faint ecchymosis 2 inches proximal to Nexplanon  site, tender to touch around this area and at the proximal tip of the Nexplanon , Nexplanon  easily palpable through the skin and original site of placement.  Steri-Strips still attached without any surrounding erythema, edema, warmth   ASSESSMENT/PLAN:   Assessment & Plan Encounter for surveillance of Nexplanon  subdermal contraceptive Tenderness around Nexplanon  site most likely due to mild edema and bruising in that area.  Reassuring that patient no longer is having radiating symptoms.  Could be that a small nerve was irritated.  Patient is neurovascularly intact on exam.  No signs of infection.  No signs that Nexplanon  implant removed.  Most likely that there is more tenderness with this placement and removal due to accumulation of some scar tissue from previous Nexplanon 's. - Reassured patient  that exam was normal and counseled for any further signs she may see.   - Instructed patient that she could remove Steri-Strips if she wanted in the shower as patient has good hemostasis no evidence of bleeding or infection.     Areta Saliva, MD [facility]

## 2023-09-10 NOTE — Patient Instructions (Signed)
 It was wonderful to see you today.  Please bring ALL of your medications with you to every visit.   Today we talked about:  Nexplanon  insertion site pain - Your exam seems normal today. I believe you have some swelling that might have irritated a nerve at the beginning. You can use ice or ibuprofen  as needed to help with this until it completely resolves.   Thank you for choosing [facility].   Please call [phone] with any questions about today's appointment.   Areta Saliva, MD  Family Medicine

## 2023-10-15 ENCOUNTER — Ambulatory Visit (INDEPENDENT_AMBULATORY_CARE_PROVIDER_SITE_OTHER): Admitting: Student

## 2023-10-15 ENCOUNTER — Encounter: Payer: Self-pay | Admitting: Student

## 2023-10-15 ENCOUNTER — Other Ambulatory Visit (HOSPITAL_COMMUNITY)
Admission: RE | Admit: 2023-10-15 | Discharge: 2023-10-15 | Disposition: A | Discharge status: Home / Self Care | Source: Ambulatory Visit | Attending: Family Medicine | Admitting: Family Medicine

## 2023-10-15 VITALS — BP 115/70 | HR 100 | Temp 99.0°F | Ht 61.0 in | Wt 160.6 lb

## 2023-10-15 DIAGNOSIS — Z0000 Encounter for general adult medical examination without abnormal findings: Secondary | ICD-10-CM | POA: Insufficient documentation

## 2023-10-15 DIAGNOSIS — Z23 Encounter for immunization: Secondary | ICD-10-CM

## 2023-10-15 DIAGNOSIS — Z113 Encounter for screening for infections with a predominantly sexual mode of transmission: Secondary | ICD-10-CM | POA: Diagnosis not present

## 2023-10-15 LAB — POCT WET PREP (WET MOUNT)
Clue Cells Wet Prep Whiff POC: POSITIVE
Trichomonas Wet Prep HPF POC: ABSENT

## 2023-10-15 NOTE — Patient Instructions (Addendum)
 Pleasure to see you today.  Today we swabbed him for routine STD testing including gonorrhea, chlamydia, trichomonas.  Also ordered lab for HIV an RPR   Today we gave you you influenza vaccine.

## 2023-10-15 NOTE — Progress Notes (Signed)
    SUBJECTIVE:   CHIEF COMPLAINT / HPI:   23 y.o. female presents for routine STD testing.   She is sexually active with female partner and inconsistent with condom use. Denies vaginal discharge, itchiness or odor.  No hematuria or recent change in medication. Currently on her period  On Nexplanon  for [facility]   PERTINENT  PMH / PSH: Reviewed   OBJECTIVE:   BP 115/70   Pulse 100   Temp 99 F (37.2 C)   Ht 5' 1 (1.549 m)   Wt 160 lb 9.6 oz (72.8 kg)   SpO2 95%   BMI 30.35 kg/m    Physical Exam General: Alert, well appearing, NAD Cardiovascular: RRR, No Murmurs, Normal S2/S2 Respiratory: CTAB, No wheezing or Rales Genitalia:  Normal introitus for age, no external lesions, no vaginal discharge but blood through the cervical os, mucosa pink and moist, no vaginal or cervical lesions  CMA Shelly served as chaperone for a pelvic exam.  ASSESSMENT/PLAN:   STD screening Sexually active female currently asymptomatic presenting for routine STD screening.Her exam generally unremarkable.  Currently on Nexplanon  for birth control. -Follow up with lab for GC, Chlamydia, Trichomonas. -Ordered HIV and RPR - Counseled on safe sex practices.    Norleen April, MD [facility]

## 2023-10-16 LAB — HIV ANTIBODY (ROUTINE TESTING W REFLEX): HIV Screen 4th Generation wRfx: NONREACTIVE

## 2023-10-16 LAB — CERVICOVAGINAL ANCILLARY ONLY
Chlamydia: NEGATIVE
Comment: NEGATIVE
Comment: NORMAL
Neisseria Gonorrhea: NEGATIVE

## 2023-10-16 LAB — RPR: RPR Ser Ql: NONREACTIVE

## 2023-10-24 ENCOUNTER — Ambulatory Visit: Payer: Self-pay | Admitting: Student

## 2023-11-08 ENCOUNTER — Encounter: Payer: Self-pay | Admitting: Student

## 2023-11-08 ENCOUNTER — Ambulatory Visit (INDEPENDENT_AMBULATORY_CARE_PROVIDER_SITE_OTHER): Admitting: Student

## 2023-11-08 VITALS — BP 129/79 | HR 88 | Ht 61.0 in | Wt 163.2 lb

## 2023-11-08 DIAGNOSIS — N926 Irregular menstruation, unspecified: Secondary | ICD-10-CM

## 2023-11-08 LAB — POCT URINE PREGNANCY: Preg Test, Ur: NEGATIVE

## 2023-11-08 MED ORDER — NORGESTIMATE-ETH ESTRADIOL 0.25-35 MG-MCG PO TABS
1.0000 | ORAL_TABLET | Freq: Every day | ORAL | 2 refills | Status: AC
Start: 2023-11-08 — End: ?

## 2023-11-08 NOTE — Progress Notes (Signed)
    SUBJECTIVE:   CHIEF COMPLAINT / HPI:   23 year old female presents today for consistent period.  She states she has been having persistent period the past 2 months.  Initially started off as spotting but now heavier. Denies having any menstrual cramp pain. Of note currently on nexplanon  for contraceptive. She's been on nexplanon  for few years now and recently change it in July of this year just before the onset of her bleeding. This is her first experience bleeding on nexplanon .  PERTINENT  PMH / PSH: Reviewed   OBJECTIVE:   BP 129/79   Pulse 88   Ht 5' 1 (1.549 m)   Wt 163 lb 3.2 oz (74 kg)   SpO2 100%   BMI 30.84 kg/m    Physical Exam General: Alert, well appearing, NAD Cardiovascular: RRR, No Murmurs, Normal S2/S2 Respiratory: CTAB, No wheezing or Rales Abdomen: No distension or tenderness   ASSESSMENT/PLAN:   Irregular Menstrual bleeding Pregnancy test was negative.  Suspect persistent bleeding could be secondary to Nexplanon . Will start patient on OCP for 3 months while on Nexplanon  to regulate bleeding. -Rx Sprintec with 3 months worth of refill.    Norleen April, MD [facility]

## 2023-11-08 NOTE — Patient Instructions (Addendum)
 Pleasure to see you today.  Your pregnancy test today was negative  Suspect your bleeding could be due to hormonal imbalance and possibly side effect from the Nexplanon .  I have sent in prescription for oral birth control called Sprintec's which you will take daily. You will take this for 3 month to help balance your hormone.

## 2023-12-05 ENCOUNTER — Telehealth: Admitting: Student

## 2023-12-05 DIAGNOSIS — Z3009 Encounter for other general counseling and advice on contraception: Secondary | ICD-10-CM

## 2023-12-05 NOTE — Progress Notes (Signed)
  Family Medicine Center Telemedicine Visit  Patient consented to have virtual visit and was identified by name and date of birth. Method of visit: Video  Encounter participants: Patient: Natallia Stellmach - located at home Provider: Damien Pinal - located at [facility]  Chief Complaint: Birth control counseling   HPI:  Had Nexplanon  placed 09/05/2023. Reported increased pain from insertion site at follow up on 09/10/23. Pain radiated down her arm. The Nexplanon  was palpable during that visit and she was neurovascularly intact.   Has been bleeding since September. Has not had this happen with her other 2 nexplanons. Would like to try a different birth control method.  Is not desiring pregnancy within the next 5+ years.   ROS: per HPI  Pertinent PMHx: as above  Exam:  There were no vitals taken for this visit.  Respiratory: no increased work of breathing noted.    Assessment & Plan Birth control counseling Discussed different forms of birth control. She has elected to try an IUD. Scheduled for next Monday 11/3 to have Nexplanon  removal and IUD insertion. Scheduled for 60 minute appointment to complete both procedures.     Time spent during visit with patient: 15 minutes

## 2023-12-09 ENCOUNTER — Encounter: Payer: Self-pay | Admitting: Student

## 2023-12-09 ENCOUNTER — Ambulatory Visit: Payer: Self-pay | Admitting: Student

## 2023-12-09 VITALS — BP 110/89 | HR 94 | Ht 61.0 in | Wt 179.0 lb

## 2023-12-09 DIAGNOSIS — Z30430 Encounter for insertion of intrauterine contraceptive device: Secondary | ICD-10-CM

## 2023-12-09 DIAGNOSIS — Z3046 Encounter for surveillance of implantable subdermal contraceptive: Secondary | ICD-10-CM | POA: Diagnosis present

## 2023-12-09 LAB — POCT URINE PREGNANCY: Preg Test, Ur: NEGATIVE

## 2023-12-09 MED ORDER — KETOROLAC TROMETHAMINE 30 MG/ML IJ SOLN
15.0000 mg | Freq: Once | INTRAMUSCULAR | Status: AC
Start: 2023-12-09 — End: 2023-12-09
  Administered 2023-12-09: 15 mg via INTRAMUSCULAR

## 2023-12-09 MED ORDER — LEVONORGESTREL 20 MCG/DAY IU IUD
1.0000 | INTRAUTERINE_SYSTEM | Freq: Once | INTRAUTERINE | Status: AC
Start: 2023-12-09 — End: 2023-12-09
  Administered 2023-12-09: 1 via INTRAUTERINE

## 2023-12-09 NOTE — Progress Notes (Unsigned)
    SUBJECTIVE:   CHIEF COMPLAINT / HPI:   Linda Galvan is a 23 y.o. female presenting for Nexplanon  removal and IUD insertion.   Nexplanon  placed 3 months ago. Requesting removal due to continued bleeding. Decided on Mirena IUD for birth control. Not desiring pregnancy within 5 years  PERTINENT  PMH / PSH: reviewed and updated.  OBJECTIVE:   BP 110/89   Pulse 94   Ht 5' 1 (1.549 m)   Wt 179 lb (81.2 kg)   SpO2 100%   BMI 33.82 kg/m   Well-appearing, no acute distress Cardio: Regular rate, regular rhythm, no murmurs on exam. Pulm: Clear, no wheezing, no crackles. No increased work of breathing  Pelvic Exam: MA chaperone present  Normal external genitalia No abnormal discharge, on menses  No cervical motion tenderness  Cervix visualized with no lesions    ASSESSMENT/PLAN:   Assessment & Plan Nexplanon  removal After verbal and written consent was obtained, patient was placed in supine position. The patient's left arm was flexed at the elbow and externally rotated so that the wrist was parallel to their ear. The device was palpated and marked. The area was cleansed with alcohol and 1% lidocaine with epinephrine was injected just under the distal end of the device. The site was cleaned with Betadine x3 and the area surrounding the device was covered with a sterile drape. A scalpel was used to create a small incision, and the device was pushed towards the incision. Fibrous tissue surrounding the device was gradually removed from the device. The device was grasped, removed and measured to ensure all 4 cm of device was removed. Steri-strips were used to close the incision. Pressure dressing was applied to the patient. The patient was instructed to remove the pressure dressing in 24 hours, and use backup contraception for 1 week. The patient denied any concerns or complaints and tolerated the procedure well.   Supervising Preceptor: Dr. Donah   Encounter for insertion of Mirena  IUD IUD INSERTION: Patient given informed consent, signed copy in the chart..  Negative pregnancy confirmed. Patient was given 15 mg Toradol  IM injection for pain control for procedure.  Appropriate time out taken.   Sterile instruments and technique was used. Cervix brought into view with use of speculum and then cleansed three times with  betadine swabs.  A tenaculum was placed into the anterior lip of the cervix and a uterine sound was used to measure uterine size.   A Mirena IUD was placed into the endometrial cavity, deployed and secured. The applicator was removed. The strings were trimmed to 2 centimeters.   There were no complications and the patient tolerated the procedure well.   She was given handouts for post procedure instructions and information about the IUD including a card with the time of recommended removal. She was reminded that the IUD does not protect against sexually transmitted diseases.  Supervising Preceptor: Dr. Donah Damien Pinal, DO [facility]

## 2023-12-09 NOTE — Patient Instructions (Signed)
 You had a Nexplanon  removed. Following this new insertion you should continue wearing your pressure bandage for 24 hours.   Keep the adhesive bandage on for 3-5 days until they fall off. It is normal to have some swelling and bruising around the site. If you notice severe swelling, fever >100.4 or drainage from the area, please come back in to be evaluated.    Today you had an IUD inserted.  The most common side effects after this type of procedure are cramping, spotting, and period-like symptoms for 24 to 48 hours. Please avoid inserting anything into the vagina for 12-24 hours after the procedure to avoid further injury and risk of infection. You should not experience any fevers, chills, abnormal discharge or foul odor. If you have any of these symptoms, please contact your healthcare provider right away.   If you have worsening bleeding and cramping, or you experience sharp pain that does not improve with over-the-counter pain control, please contact us  by phone or through MyChart right away.  Home Pain control: I recommend taking 400-600mg  of ibuprofen  every 4 hours for the next 24 hours to manage the pain and cramping that can happen following this procedure. For breakthrough pain, you can use 500 mg of tylenol, with at least 4 hours between doses. Heating pads, warm baths and other home pain control measures are also acceptable.   Return to Fertility: For the Myrena/Kyleena/Skyla IUD, it can take up to 3 months for your hormones to return to their baseline, and fertility to return. This does not mean you cannot get pregnant during this time. If you do not desire pregnancy immediately, you should use other forms of birth control, such as condoms or birth control pills. If you do desire pregnancy, please keep this timeline in mind when considering if you are having difficulty becoming pregnant. While trying to conceive I recommend taking a prenatal vitamin to optimize your health. Your vitamin  should contain at least 400mg  of folic acid.  Pregnancy Prevention: For the progesterone containing implants, hormonal regulation and pregnancy prevention will not reach peak effect for about a month following placement. Please use condoms in the interim to prevent pregnancy and anytime you have intercourse to prevent transmission of STIs. If you have the copper IUD, pregnancy prevention will begin immediately, but will reach peak effectiveness within one week of placement. As with the progesterone IUD, I recommend using condoms for pregnancy prevention during this time.   FAQ: How do I know my IUD is working?- your IUD will start working in the time frame listed above. Some patients may experience lighter, less frequent periods, or stop having them all together with the progesterone containing IUDs.  How do I know my IUD is in the correct place? You should be able to feel the strings of your IUD in your vagina. If you do not, or you have a concern, any health care provider can check to see if they are present. If they are not visible, you may need to have an ultrasound to confirm the appropriate position.  How long will my IUD last? Length of effectiveness depends on what type of IUD you have, and how strong the dose of hormones is if hormones are present. The copper IUD lasts up to 12 years, and the progesterone IUD lasts 3, 5, or 10 years based on what dose of progesterone is present.  What if my IUD fails? If you become pregnant while using your IUD, it is important to contact your  healthcare provider right away. While extremely rare, pregnancy with an IUD in place can be dangerous, and should be addressed quickly for your safety and continued good health.

## 2023-12-27 ENCOUNTER — Ambulatory Visit (INDEPENDENT_AMBULATORY_CARE_PROVIDER_SITE_OTHER)

## 2023-12-27 ENCOUNTER — Other Ambulatory Visit (HOSPITAL_COMMUNITY)
Admission: RE | Admit: 2023-12-27 | Discharge: 2023-12-27 | Disposition: A | Discharge status: Home / Self Care | Source: Ambulatory Visit | Attending: Family Medicine | Admitting: Family Medicine

## 2023-12-27 VITALS — BP 150/83 | HR 103 | Ht 61.0 in | Wt 161.5 lb

## 2023-12-27 DIAGNOSIS — N765 Ulceration of vagina: Secondary | ICD-10-CM

## 2023-12-27 LAB — POCT WET PREP (WET MOUNT)
Clue Cells Wet Prep Whiff POC: POSITIVE
Trichomonas Wet Prep HPF POC: ABSENT

## 2023-12-27 NOTE — Progress Notes (Signed)
    SUBJECTIVE:   CHIEF COMPLAINT / HPI: Vaginal pain   Linda Galvan is a 23 y.o. female present w/ vaginal pain. Patient states pain started a few days ago after she started her period and used super Tampon. Denies fever, itchiness, abdominal pain, pelvic pain, or vaginal discharge. Last intercourse was 2 weeks ago    PERTINENT  PMH / PSH:  Reviewed   OBJECTIVE:   BP (!) 150/83   Pulse (!) 103   Ht 5' 1 (1.549 m)   Wt 161 lb 8 oz (73.3 kg)   SpO2 100%   BMI 30.52 kg/m   Physical Exam Genitourinary:    Labia:        Left: Lesion present.       Comments: Sub centimeter lesion on right labia      ASSESSMENT/PLAN:   Assessment & Plan Vaginal ulcer STI ruled out : RPR, HIV, Wet Prep, and GC/Chlam  Follow up in 1-2 weeks if remain w/ pain   Houston Samuels, DO PGY1 Family Medicine Resident [facility]

## 2023-12-27 NOTE — Patient Instructions (Addendum)
   It was great to see you!  We are checking some labs today, we will release these results to your MyChart. Follow up in 1-2 weeks if still w/ pain or worsening symptom  Take care and seek immediate care sooner if you develop any concerns.      Linda Galvan HAS PGY 1 Family Medicine Resident [facility]  [address] [place], [place] [address] Fax [phone] Phone [phone] 12/27/2023, 4:05 PM

## 2023-12-28 ENCOUNTER — Ambulatory Visit: Payer: Self-pay

## 2023-12-28 LAB — SYPHILIS: RPR W/REFLEX TO RPR TITER AND TREPONEMAL ANTIBODIES, TRADITIONAL SCREENING AND DIAGNOSIS ALGORITHM: RPR Ser Ql: NONREACTIVE

## 2023-12-28 LAB — HIV ANTIBODY (ROUTINE TESTING W REFLEX): HIV Screen 4th Generation wRfx: NONREACTIVE

## 2023-12-28 MED ORDER — METRONIDAZOLE 500 MG PO TABS: 500.0000 mg | ORAL_TABLET | Freq: Two times a day (BID) | ORAL | 0 refills | Status: AC

## 2023-12-28 NOTE — Telephone Encounter (Signed)
 Call Ms. Wood to notify her + Clue cells and Whiff test on wet prep. Metronidazole  500 mg BID for 7 days sent to her [place] pharmacy.  Pt verbalized an understanding of the lab results and treatment plan   Houston Samuels, DO  PGY1 Family Medicine Resident

## 2023-12-31 LAB — CERVICOVAGINAL ANCILLARY ONLY
Chlamydia: NEGATIVE
Comment: NEGATIVE
Comment: NEGATIVE
Comment: NORMAL
Neisseria Gonorrhea: NEGATIVE
Trichomonas: NEGATIVE

## 2024-02-14 ENCOUNTER — Ambulatory Visit: Admitting: Family Medicine

## 2024-02-14 VITALS — BP 129/80 | HR 89 | Ht 61.0 in | Wt 163.4 lb

## 2024-02-14 DIAGNOSIS — N9089 Other specified noninflammatory disorders of vulva and perineum: Secondary | ICD-10-CM

## 2024-02-14 DIAGNOSIS — Z23 Encounter for immunization: Secondary | ICD-10-CM

## 2024-02-14 NOTE — Patient Instructions (Signed)
 It was wonderful to see you today!  You have a small abrasion to the skin on the outside of your vulva between the labia minora and majora on the right. You can put a small amount of vaseline on it daily to protect the skin while it heals, but otherwise, it should go away on its own. Use a thicker amount of toilet paper when you have nail extensions to protect your vulva and avoid scratches in the future. If you notice, redness, swelling or discharge, these are signs of infection, and you should come back to be seen again, as you may need further treatment.   Please call [phone] with any questions about today's appointment.   If you need any additional refills, please call your pharmacy before calling the office.  Lucie Pinal, DO Family Medicine

## 2024-02-14 NOTE — Progress Notes (Signed)
" ° ° °  SUBJECTIVE:   CHIEF COMPLAINT / HPI:   Vulvar irritation - on OCPs, sprintec 28 - scratched the outside of her vulva with her nail 2 days ago - no bleeding or ulcerations noticed. - took a picture with her phone when she noticed it was sore after wiping with toilet paper and saw a small abrasion  PERTINENT  PMH / PSH: noncontributory  OBJECTIVE:   BP 129/80   Pulse 89   Ht 5' 1 (1.549 m)   Wt 163 lb 6.4 oz (74.1 kg)   SpO2 98%   BMI 30.87 kg/m   GU: Thersia Meissner present as chaperone. Labia majora and minora appear normal. Small abrasion noted on the right on the inside of the labia minora, but outside of the vaginal vestibule. All other skin intact, no lesions, nodules or other marks noted.   ASSESSMENT/PLAN:   Assessment & Plan Vulvar irritation - small abrasion noted outside the vaginal vestibule on the right - current signs of infection or bleeding - advised using vaseline to protect the abrasion, and counseled using a thicker piece of toilet paper while the patient has nail extensions to avoid re-injury - return precautions provided - no follow up needed at this time.    Lucie Pinal, DO [facility] "
